# Patient Record
Sex: Male | Born: 2000 | Race: White | Hispanic: No | Marital: Single
Health system: Southern US, Community
[De-identification: ages and names within clinical notes are randomized; demographics above are authoritative.]

---
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
## 2022-04-07 DIAGNOSIS — H7201 Central perforation of tympanic membrane, right ear: Secondary | ICD-10-CM | POA: Diagnosis not present

## 2022-06-17 DIAGNOSIS — R1013 Epigastric pain: Secondary | ICD-10-CM | POA: Diagnosis not present

## 2022-06-17 DIAGNOSIS — K219 Gastro-esophageal reflux disease without esophagitis: Secondary | ICD-10-CM | POA: Diagnosis not present

## 2022-06-23 DIAGNOSIS — H7201 Central perforation of tympanic membrane, right ear: Secondary | ICD-10-CM | POA: Diagnosis not present

## 2022-06-23 DIAGNOSIS — H6981 Other specified disorders of Eustachian tube, right ear: Secondary | ICD-10-CM | POA: Diagnosis not present

## 2022-08-29 DIAGNOSIS — F419 Anxiety disorder, unspecified: Secondary | ICD-10-CM | POA: Diagnosis not present

## 2022-08-29 DIAGNOSIS — F331 Major depressive disorder, recurrent, moderate: Secondary | ICD-10-CM | POA: Diagnosis not present

## 2022-08-29 DIAGNOSIS — Z1331 Encounter for screening for depression: Secondary | ICD-10-CM | POA: Diagnosis not present

## 2022-09-05 ENCOUNTER — Other Ambulatory Visit (HOSPITAL_COMMUNITY): Payer: Self-pay

## 2022-09-06 ENCOUNTER — Other Ambulatory Visit (HOSPITAL_COMMUNITY): Payer: Self-pay

## 2022-09-06 DIAGNOSIS — F411 Generalized anxiety disorder: Secondary | ICD-10-CM | POA: Diagnosis not present

## 2022-09-06 DIAGNOSIS — F320 Major depressive disorder, single episode, mild: Secondary | ICD-10-CM | POA: Diagnosis not present

## 2022-09-06 MED ORDER — QVAR REDIHALER 40 MCG/ACT IN AERB
INHALATION_SPRAY | RESPIRATORY_TRACT | 1 refills | Status: AC
  Filled 2022-09-06: qty 10.6, 30d supply, fill #0

## 2022-09-12 DIAGNOSIS — F411 Generalized anxiety disorder: Secondary | ICD-10-CM | POA: Diagnosis not present

## 2022-09-12 DIAGNOSIS — F320 Major depressive disorder, single episode, mild: Secondary | ICD-10-CM | POA: Diagnosis not present

## 2022-09-20 DIAGNOSIS — F411 Generalized anxiety disorder: Secondary | ICD-10-CM | POA: Diagnosis not present

## 2022-09-20 DIAGNOSIS — F320 Major depressive disorder, single episode, mild: Secondary | ICD-10-CM | POA: Diagnosis not present

## 2022-09-26 DIAGNOSIS — F320 Major depressive disorder, single episode, mild: Secondary | ICD-10-CM | POA: Diagnosis not present

## 2022-09-26 DIAGNOSIS — F411 Generalized anxiety disorder: Secondary | ICD-10-CM | POA: Diagnosis not present

## 2022-10-04 DIAGNOSIS — F411 Generalized anxiety disorder: Secondary | ICD-10-CM | POA: Diagnosis not present

## 2022-10-04 DIAGNOSIS — F320 Major depressive disorder, single episode, mild: Secondary | ICD-10-CM | POA: Diagnosis not present

## 2022-10-18 DIAGNOSIS — F411 Generalized anxiety disorder: Secondary | ICD-10-CM | POA: Diagnosis not present

## 2022-10-18 DIAGNOSIS — F320 Major depressive disorder, single episode, mild: Secondary | ICD-10-CM | POA: Diagnosis not present

## 2022-11-16 DIAGNOSIS — L299 Pruritus, unspecified: Secondary | ICD-10-CM | POA: Diagnosis not present

## 2022-11-16 DIAGNOSIS — Z1339 Encounter for screening examination for other mental health and behavioral disorders: Secondary | ICD-10-CM | POA: Diagnosis not present

## 2022-11-16 DIAGNOSIS — Z1331 Encounter for screening for depression: Secondary | ICD-10-CM | POA: Diagnosis not present

## 2022-11-16 DIAGNOSIS — J4520 Mild intermittent asthma, uncomplicated: Secondary | ICD-10-CM | POA: Diagnosis not present

## 2022-11-25 DIAGNOSIS — J4540 Moderate persistent asthma, uncomplicated: Secondary | ICD-10-CM | POA: Diagnosis not present

## 2022-11-25 DIAGNOSIS — J301 Allergic rhinitis due to pollen: Secondary | ICD-10-CM | POA: Diagnosis not present

## 2022-11-25 DIAGNOSIS — J3089 Other allergic rhinitis: Secondary | ICD-10-CM | POA: Diagnosis not present

## 2022-11-25 DIAGNOSIS — H1045 Other chronic allergic conjunctivitis: Secondary | ICD-10-CM | POA: Diagnosis not present

## 2022-11-25 DIAGNOSIS — J3081 Allergic rhinitis due to animal (cat) (dog) hair and dander: Secondary | ICD-10-CM | POA: Diagnosis not present

## 2022-12-19 DIAGNOSIS — M25512 Pain in left shoulder: Secondary | ICD-10-CM | POA: Diagnosis not present

## 2023-01-06 DIAGNOSIS — L299 Pruritus, unspecified: Secondary | ICD-10-CM | POA: Diagnosis not present

## 2023-01-09 DIAGNOSIS — Z1330 Encounter for screening examination for mental health and behavioral disorders, unspecified: Secondary | ICD-10-CM | POA: Diagnosis not present

## 2023-03-31 DIAGNOSIS — J301 Allergic rhinitis due to pollen: Secondary | ICD-10-CM | POA: Diagnosis not present

## 2023-03-31 DIAGNOSIS — J4540 Moderate persistent asthma, uncomplicated: Secondary | ICD-10-CM | POA: Diagnosis not present

## 2023-03-31 DIAGNOSIS — J3081 Allergic rhinitis due to animal (cat) (dog) hair and dander: Secondary | ICD-10-CM | POA: Diagnosis not present

## 2023-03-31 DIAGNOSIS — H1045 Other chronic allergic conjunctivitis: Secondary | ICD-10-CM | POA: Diagnosis not present

## 2023-03-31 DIAGNOSIS — J3089 Other allergic rhinitis: Secondary | ICD-10-CM | POA: Diagnosis not present

## 2023-05-19 DIAGNOSIS — L501 Idiopathic urticaria: Secondary | ICD-10-CM | POA: Diagnosis not present

## 2023-05-19 DIAGNOSIS — J4540 Moderate persistent asthma, uncomplicated: Secondary | ICD-10-CM | POA: Diagnosis not present

## 2023-05-19 DIAGNOSIS — J301 Allergic rhinitis due to pollen: Secondary | ICD-10-CM | POA: Diagnosis not present

## 2023-05-19 DIAGNOSIS — J3081 Allergic rhinitis due to animal (cat) (dog) hair and dander: Secondary | ICD-10-CM | POA: Diagnosis not present

## 2023-05-19 DIAGNOSIS — J3089 Other allergic rhinitis: Secondary | ICD-10-CM | POA: Diagnosis not present

## 2023-06-20 DIAGNOSIS — B86 Scabies: Secondary | ICD-10-CM | POA: Diagnosis not present

## 2023-07-04 DIAGNOSIS — L501 Idiopathic urticaria: Secondary | ICD-10-CM | POA: Diagnosis not present

## 2023-07-04 DIAGNOSIS — H7201 Central perforation of tympanic membrane, right ear: Secondary | ICD-10-CM | POA: Diagnosis not present

## 2023-07-04 DIAGNOSIS — R040 Epistaxis: Secondary | ICD-10-CM | POA: Diagnosis not present
